# Patient Record
Sex: Male | Born: 1965 | Race: White | Hispanic: No | Smoking: Current some day smoker
Health system: Southern US, Community
[De-identification: ages and names within clinical notes are randomized; demographics above are authoritative.]

## PROBLEM LIST (undated history)

## (undated) DIAGNOSIS — I10 Essential (primary) hypertension: Secondary | ICD-10-CM

## (undated) DIAGNOSIS — T7840XA Allergy, unspecified, initial encounter: Secondary | ICD-10-CM

## (undated) DIAGNOSIS — M1990 Unspecified osteoarthritis, unspecified site: Secondary | ICD-10-CM

## (undated) DIAGNOSIS — E785 Hyperlipidemia, unspecified: Secondary | ICD-10-CM

## (undated) HISTORY — DX: Allergy, unspecified, initial encounter: T78.40XA

## (undated) HISTORY — DX: Hyperlipidemia, unspecified: E78.5

## (undated) HISTORY — DX: Essential (primary) hypertension: I10

## (undated) HISTORY — DX: Unspecified osteoarthritis, unspecified site: M19.90

---

## 1997-09-08 ENCOUNTER — Emergency Department (HOSPITAL_COMMUNITY): Admission: EM | Admit: 1997-09-08 | Discharge: 1997-09-08 | Payer: Self-pay | Admitting: Emergency Medicine

## 1998-06-27 ENCOUNTER — Emergency Department (HOSPITAL_COMMUNITY): Admission: EM | Admit: 1998-06-27 | Discharge: 1998-06-27 | Payer: Self-pay | Admitting: Emergency Medicine

## 1998-06-27 ENCOUNTER — Encounter: Payer: Self-pay | Admitting: Emergency Medicine

## 2016-08-30 ENCOUNTER — Emergency Department (HOSPITAL_COMMUNITY)
Admission: EM | Admit: 2016-08-30 | Discharge: 2016-08-30 | Disposition: A | Discharge status: Home / Self Care | Payer: BLUE CROSS/BLUE SHIELD | Attending: Emergency Medicine | Admitting: Emergency Medicine

## 2016-08-30 ENCOUNTER — Encounter (HOSPITAL_COMMUNITY): Payer: Self-pay | Admitting: Emergency Medicine

## 2016-08-30 DIAGNOSIS — Y939 Activity, unspecified: Secondary | ICD-10-CM | POA: Insufficient documentation

## 2016-08-30 DIAGNOSIS — Y929 Unspecified place or not applicable: Secondary | ICD-10-CM | POA: Insufficient documentation

## 2016-08-30 DIAGNOSIS — W290XXA Contact with powered kitchen appliance, initial encounter: Secondary | ICD-10-CM | POA: Insufficient documentation

## 2016-08-30 DIAGNOSIS — Z23 Encounter for immunization: Secondary | ICD-10-CM | POA: Insufficient documentation

## 2016-08-30 DIAGNOSIS — F17200 Nicotine dependence, unspecified, uncomplicated: Secondary | ICD-10-CM | POA: Insufficient documentation

## 2016-08-30 DIAGNOSIS — Y999 Unspecified external cause status: Secondary | ICD-10-CM | POA: Diagnosis not present

## 2016-08-30 DIAGNOSIS — S61012A Laceration without foreign body of left thumb without damage to nail, initial encounter: Secondary | ICD-10-CM | POA: Diagnosis not present

## 2016-08-30 MED ORDER — TETANUS-DIPHTH-ACELL PERTUSSIS 5-2.5-18.5 LF-MCG/0.5 IM SUSP
0.5000 mL | Freq: Once | INTRAMUSCULAR | Status: AC
  Administered 2016-08-30: 0.5 mL via INTRAMUSCULAR
  Filled 2016-08-30: qty 0.5

## 2016-08-30 MED ORDER — BACITRACIN ZINC 500 UNIT/GM EX OINT
TOPICAL_OINTMENT | Freq: Two times a day (BID) | CUTANEOUS | Status: DC
  Administered 2016-08-30: 1 via TOPICAL
  Filled 2016-08-30: qty 0.9

## 2016-08-30 NOTE — ED Provider Notes (Signed)
[facility] DEPT Provider Note   CSN: [id number] Arrival date & time: 08/30/16  1645     History   Chief Complaint Chief Complaint  Patient presents with  . Thumb Injury    HPI Erik Lin is a 51 y.o. male.  The history is provided by the patient.  51 year old male Who presents with left hand injury at 11:30AM. He is right-hand dominant and has no significant past medical history. Does not take blood thinners. Was wearing protective padding gloves and cutting metal with a grinder. He states that the wheel of the grinder accidentally touched the thumb of his left hand. It created a superficial laceration for which she was seen at urgent care. Urgent care was concerned about nail needed to be removed and he was sent to the ED for evaluation. Denies any other injuries. Unknown last tetanus shot. An x-ray was performed and he was told he did not have fracture or foreign bodies. He denies any skin discoloration, numbness or weakness.  History reviewed. No pertinent past medical history.  There are no active problems to display for this patient.   History reviewed. No pertinent surgical history.     Home Medications    Prior to Admission medications   Not on File    Family History No family history on file.  Social History Social History  Substance Use Topics  . Smoking status: Current Some Day Smoker  . Smokeless tobacco: Never Used  . Alcohol use Yes     Allergies   Patient has no known allergies.   Review of Systems Review of Systems  Constitutional: Negative for fever.  Respiratory: Negative for shortness of breath.   Cardiovascular: Negative for chest pain.  Skin: Positive for wound.  Allergic/Immunologic: Negative for immunocompromised state.  Neurological: Negative for weakness and numbness.  Hematological: Does not bruise/bleed easily.  Psychiatric/Behavioral: Negative for confusion.  All other systems reviewed and are negative.    Physical  Exam Updated Vital Signs BP (!) 158/97 (BP Location: Left Arm)   Pulse 78   Temp 97.7 F (36.5 C) (Oral)   Resp 18   Ht 5\' 6"  (1.676 m)   Wt 166 lb (75.3 kg)   SpO2 100%   BMI 26.79 kg/m   Physical Exam Physical Exam  Nursing note and vitals reviewed. Constitutional: Well developed, well nourished, non-toxic, and in no acute distress Head: Normocephalic and atraumatic.  Mouth/Throat: Oropharynx is clear and moist.  Neck: Normal range of motion. Neck supple.  Cardiovascular: Normal rate and regular rhythm.  +2 radial pulse bilaterally Pulmonary/Chest: Effort normal and breath sounds normal.  Abdominal: Soft. There is no tenderness. There is no rebound and no guarding.  Musculoskeletal: Normal range of motion of all digits of the left hand.  Neurological: Alert, no facial droop, fluent speech, moves all extremities symmetrically, sensation to light touch in tact over left hand Skin: Skin is warm and dry. there is laceration involving the lateral edge of the thumb nail without bleeding or foreign body. No subungal hematoma. Intact nailbed.  Psychiatric: Cooperative   ED Treatments / Results  Labs (all labs ordered are listed, but only abnormal results are displayed) Labs Reviewed - No data to display  EKG  EKG Interpretation None       Radiology No results found.  Procedures Procedures (including critical care time)  Medications Ordered in ED Medications  Tdap (BOOSTRIX) injection 0.5 mL (not administered)  bacitracin ointment (not administered)     Initial  Impression / Assessment and Plan / ED Course  I have reviewed the triage vital signs and the nursing notes.  Pertinent labs & imaging results that were available during my care of the patient were reviewed by me and considered in my medical decision making (see chart for details).     Tetanus updated. Hand neurovascularly in tact. No obvious nailbed injury and intact nailbed without subungual hematoma. Not  felt to require removal of the nail. X-ray was obtained at [facility] and he was told he did not have fracture or foreign body. Discussed supportive care/wound care instructions. Strict return and follow-up instructions reviewed. He expressed understanding of all discharge instructions and felt comfortable with the plan of care.   Final Clinical Impressions(s) / ED Diagnoses   Final diagnoses:  Thumb laceration, left, initial encounter    New Prescriptions New Prescriptions   No medications on file     Forde Dandy, MD 08/30/16 1734

## 2016-08-30 NOTE — ED Notes (Signed)
Security has pt's knife

## 2016-08-30 NOTE — ED Triage Notes (Signed)
Pt here from [facility] requesting left thumb nail to be removed; injury result cut off wheel on grinder.

## 2016-08-30 NOTE — Discharge Instructions (Signed)
Keep injury covered. You can continue to wash it with soapy water, but keep dry otherwise and covered especially while at work. You can use bacitracin over wound two times daily.  Return without fail for worsening symptoms, including fever, pus drainage, increased redness/swelling or pain, or any other symptoms concerning to you.

## 2019-10-22 ENCOUNTER — Ambulatory Visit: Payer: Self-pay | Attending: Internal Medicine

## 2019-10-22 DIAGNOSIS — Z23 Encounter for immunization: Secondary | ICD-10-CM

## 2019-10-22 NOTE — Progress Notes (Signed)
   Covid-19 Vaccination Clinic  Name:  MCARTHUR BERNASCONI    MRN: [id number] DOB: 01-18-1966  10/22/2019  Mr. Herdman was observed post Covid-19 immunization for 15 minutes without incident. He was provided with Vaccine Information Sheet and instruction to access the V-Safe system.   Mr. Buchanan was instructed to call 911 with any severe reactions post vaccine: Marland Kitchen Difficulty breathing  . Swelling of face and throat  . A fast heartbeat  . A bad rash all over body  . Dizziness and weakness   Immunizations Administered    Name Date Dose VIS Date Route   Pfizer COVID-19 Vaccine 10/22/2019 11:19 AM 0.3 mL 08/03/2018 Intramuscular   Manufacturer: [employer]   Lot: [id number]   Spray: [id number]

## 2019-11-14 ENCOUNTER — Ambulatory Visit: Payer: Self-pay | Attending: Internal Medicine

## 2019-11-14 DIAGNOSIS — Z23 Encounter for immunization: Secondary | ICD-10-CM

## 2019-11-14 NOTE — Progress Notes (Signed)
   Covid-19 Vaccination Clinic  Name:  Erik Lin    MRN: [id number] DOB: [birth year]  11/14/2019  Mr. Erik Lin was observed post Covid-19 immunization for 30 minutes based on pre-vaccination screening without incident. He was provided with Vaccine Information Sheet and instruction to access the V-Safe system.   Mr. Erik Lin was instructed to call 911 with any severe reactions post vaccine: Marland Kitchen Difficulty breathing  . Swelling of face and throat  . A fast heartbeat  . A bad rash all over body  . Dizziness and weakness   Immunizations Administered    Name Date Dose VIS Date Route   Pfizer COVID-19 Vaccine 11/14/2019  8:45 AM 0.3 mL 08/03/2018 Intramuscular   Manufacturer: [employer]   Lot: [id number]   [place]: [address]-6

## 2020-05-25 ENCOUNTER — Other Ambulatory Visit: Payer: Self-pay

## 2020-05-25 ENCOUNTER — Encounter: Payer: Self-pay | Admitting: Internal Medicine

## 2020-05-25 ENCOUNTER — Ambulatory Visit: Payer: PRIVATE HEALTH INSURANCE | Admitting: Internal Medicine

## 2020-05-25 VITALS — BP 131/90 | HR 64 | Ht 66.0 in | Wt 174.7 lb

## 2020-05-25 DIAGNOSIS — M1832 Unilateral post-traumatic osteoarthritis of first carpometacarpal joint, left hand: Secondary | ICD-10-CM

## 2020-05-25 DIAGNOSIS — Z1159 Encounter for screening for other viral diseases: Secondary | ICD-10-CM | POA: Diagnosis not present

## 2020-05-25 DIAGNOSIS — Z5320 Procedure and treatment not carried out because of patient's decision for unspecified reasons: Secondary | ICD-10-CM | POA: Diagnosis not present

## 2020-05-25 DIAGNOSIS — L80 Vitiligo: Secondary | ICD-10-CM | POA: Insufficient documentation

## 2020-05-25 DIAGNOSIS — I10 Essential (primary) hypertension: Secondary | ICD-10-CM

## 2020-05-25 DIAGNOSIS — Z114 Encounter for screening for human immunodeficiency virus [HIV]: Secondary | ICD-10-CM | POA: Diagnosis not present

## 2020-05-25 DIAGNOSIS — M189 Osteoarthritis of first carpometacarpal joint, unspecified: Secondary | ICD-10-CM | POA: Insufficient documentation

## 2020-05-25 DIAGNOSIS — Z789 Other specified health status: Secondary | ICD-10-CM | POA: Insufficient documentation

## 2020-05-25 DIAGNOSIS — F1090 Alcohol use, unspecified, uncomplicated: Secondary | ICD-10-CM | POA: Insufficient documentation

## 2020-05-25 DIAGNOSIS — Z0000 Encounter for general adult medical examination without abnormal findings: Secondary | ICD-10-CM

## 2020-05-25 DIAGNOSIS — Z7289 Other problems related to lifestyle: Secondary | ICD-10-CM

## 2020-05-25 DIAGNOSIS — Z1211 Encounter for screening for malignant neoplasm of colon: Secondary | ICD-10-CM

## 2020-05-25 NOTE — Progress Notes (Signed)
   CC: high blood pressure   HPI:  Mr.Erik Lin is a 54 y.o. with PMH as below.   Please see A&P for assessment of the patient's acute and chronic medical conditions.   Allergies and recurrent sinusitis. Fungal ear infections   Dad - prostate cancer  Mom - RA, vitiligo   Lives in [place].  [employer] superintendent  Smoke 4-5 cigarettes 5 nights per week  Drinks four beers per night and more on weekend   No past medical history on file.   Review of Systems:   Review of Systems  Constitutional: Negative for chills, diaphoresis, fever, malaise/fatigue and weight loss.  HENT: Negative for congestion, ear discharge, ear pain, sinus pain, sore throat and tinnitus.   Respiratory: Negative for cough, shortness of breath and wheezing.   Cardiovascular: Negative for chest pain, palpitations, orthopnea, claudication and leg swelling.  Gastrointestinal: Negative for abdominal pain, constipation, diarrhea, heartburn, melena, nausea and vomiting.  Genitourinary: Negative for dysuria, frequency and urgency.  Musculoskeletal: Positive for joint pain. Negative for falls and myalgias.  Neurological: Negative for dizziness, weakness and headaches.   Physical Exam: Constitution: NAD, appears stated age, well nourished appearing HENT: [place]/AT, moist mucous membranes Eyes: no icterus or injection  Cardio: RRR, no m/r/g, no LE edema  Respiratory: CTA, no w/r/r Abdominal: NTTP, soft, non-distended MSK:  LUE: squared base of 1st metacarpal with tenderness at base, pain with pinching 1st and 5th phalangeal, no swelling, erythema, warmth or limited ROM Neuro: normal affect, a&ox3 Skin: c/d/i    Vitals:   05/25/20 1030  BP: 131/90  Pulse: 64  SpO2: 98%    Assessment & Plan:   See Encounters Tab for problem based charting.  Patient discussed with Dr. Rebeca Alert

## 2020-05-25 NOTE — Assessment & Plan Note (Signed)
He has occasional pain in the base of his thumb, none currently. He injured it in 2019 and has had intermittent pain since then. He occasionally takes Iburprofen and uses aspercreme, both of which helps.  PE consistent with osteoarthritis. He does have vitiligo and family history of RA but no consistent symptoms of RA at this time.   - continue ibuprofen prn, discussed limiting use with alcohol use and risks and benefits  - cont. aspercreme  - f/u if symptoms worsen

## 2020-05-25 NOTE — Assessment & Plan Note (Signed)
Drinks 4 12oz bud light limes per night.  - discussed avoiding alcohol and NSAIDs together  - address decreasing alcohol consumption at follow-up

## 2020-05-25 NOTE — Progress Notes (Signed)
Internal Medicine Clinic Attending  Case discussed with Dr. Seawell at the time of the visit.  We reviewed the resident's history and exam and pertinent patient test results.  I agree with the assessment, diagnosis, and plan of care documented in the resident's note.  Marylynn Rigdon, M.D., Ph.D.  

## 2020-05-25 NOTE — Patient Instructions (Signed)
Thank you for allowing [place] to provide your care today. Today we discussed your osteoarthritis  I have ordered the following labs for you:  Basic metabolic panel, diabetes test, HCV and HIV screening    I will call if any are abnormal.    Today we made the following changes to your medications:   Please follow-up in one year.    Please call the internal medicine center clinic if you have any questions or concerns, we may be able to help and keep you from a long and expensive emergency room wait. Our clinic and after hours phone number is [phone], the best time to call is Monday through Friday 9 am to 4 pm but there is always someone available 24/7 if you have an emergency. If you need medication refills please notify your pharmacy one week in advance and they will send [place] a request.

## 2020-05-25 NOTE — Assessment & Plan Note (Signed)
BP Readings from Last 3 Encounters:  05/25/20 131/90  08/30/16 (!) 158/97   Blood presure initially 158/97. Repeat 131/90.   - check bmp, a1c, lipid panel

## 2020-05-25 NOTE — Assessment & Plan Note (Signed)
Vitiligo for the past 10-15 years that affects his wrists and feet. His mother also has vitiligo. No systemic symptoms. The discoloration does not bother him.   - TSH today

## 2020-05-25 NOTE — Assessment & Plan Note (Signed)
-   discussed obtaining covid booster. He has plans to do this but just has not had time. Encouraged him to obtain this  - declined flu vaccine - HCV/HIV screening today  - referral for screening colonoscopy

## 2020-05-26 LAB — BMP8+ANION GAP
Anion Gap: 15 mmol/L (ref 10.0–18.0)
BUN/Creatinine Ratio: 12 (ref 9–20)
BUN: 11 mg/dL (ref 6–24)
CO2: 24 mmol/L (ref 20–29)
Calcium: 9.7 mg/dL (ref 8.7–10.2)
Chloride: 100 mmol/L (ref 96–106)
Creatinine, Ser: 0.92 mg/dL (ref 0.76–1.27)
GFR calc Af Amer: 110 mL/min/{1.73_m2} (ref >59)
GFR calc non Af Amer: 95 mL/min/{1.73_m2} (ref >59)
Glucose: 79 mg/dL (ref 65–99)
Potassium: 4.9 mmol/L (ref 3.5–5.2)
Sodium: 139 mmol/L (ref 134–144)

## 2020-05-26 LAB — HIV ANTIBODY (ROUTINE TESTING W REFLEX): HIV Screen 4th Generation wRfx: NONREACTIVE

## 2020-05-26 LAB — LIPID PANEL
Chol/HDL Ratio: 3.4 ratio (ref 0.0–5.0)
Cholesterol, Total: 174 mg/dL (ref 100–199)
HDL: 51 mg/dL (ref >39)
LDL Chol Calc (NIH): 105 mg/dL — ABNORMAL HIGH (ref 0–99)
Triglycerides: 97 mg/dL (ref 0–149)
VLDL Cholesterol Cal: 18 mg/dL (ref 5–40)

## 2020-05-26 LAB — HEPATITIS C ANTIBODY: Hep C Virus Ab: 0.6 s/co ratio (ref 0.0–0.9)

## 2020-05-26 LAB — HEMOGLOBIN A1C
Est. average glucose Bld gHb Est-mCnc: 108 mg/dL
Hgb A1c MFr Bld: 5.4 % (ref 4.8–5.6)

## 2020-05-26 LAB — TSH: TSH: 0.954 u[IU]/mL (ref 0.450–4.500)

## 2020-05-28 ENCOUNTER — Encounter: Payer: Self-pay | Admitting: Internal Medicine

## 2020-06-27 ENCOUNTER — Encounter: Payer: Self-pay | Admitting: *Deleted

## 2020-08-15 ENCOUNTER — Ambulatory Visit (AMBULATORY_SURGERY_CENTER): Payer: PRIVATE HEALTH INSURANCE

## 2020-08-15 ENCOUNTER — Encounter: Payer: Self-pay | Admitting: Gastroenterology

## 2020-08-15 ENCOUNTER — Other Ambulatory Visit: Payer: Self-pay

## 2020-08-15 VITALS — Ht 66.0 in | Wt 171.0 lb

## 2020-08-15 DIAGNOSIS — Z1211 Encounter for screening for malignant neoplasm of colon: Secondary | ICD-10-CM

## 2020-08-15 MED ORDER — PLENVU 140 G PO SOLR: 1.0000 | ORAL | 0 refills | Status: DC

## 2020-08-15 NOTE — Progress Notes (Signed)
No allergies to soy or egg Pt is not on blood thinners or diet pills Denies  Sedation/intubation d/t to no surgeries or procedures. Denies atrial flutter/fib Denies constipation   Emmi instructions given to pt  Pt is aware of Covid safety and care partner requirements.

## 2020-08-23 ENCOUNTER — Telehealth: Payer: Self-pay | Admitting: Gastroenterology

## 2020-08-23 NOTE — Telephone Encounter (Signed)
Patient in a meeting. Patient notified that new prep instructions went to his mychart. Pt will call us back with questions.

## 2020-08-27 ENCOUNTER — Encounter: Payer: PRIVATE HEALTH INSURANCE | Admitting: Gastroenterology

## 2020-09-24 ENCOUNTER — Ambulatory Visit (AMBULATORY_SURGERY_CENTER): Payer: PRIVATE HEALTH INSURANCE | Admitting: Gastroenterology

## 2020-09-24 ENCOUNTER — Encounter: Payer: Self-pay | Admitting: Gastroenterology

## 2020-09-24 ENCOUNTER — Other Ambulatory Visit: Payer: Self-pay

## 2020-09-24 VITALS — BP 123/83 | HR 63 | Temp 98.4°F | Resp 14 | Ht 66.0 in | Wt 171.0 lb

## 2020-09-24 DIAGNOSIS — D125 Benign neoplasm of sigmoid colon: Secondary | ICD-10-CM

## 2020-09-24 DIAGNOSIS — Z1211 Encounter for screening for malignant neoplasm of colon: Secondary | ICD-10-CM

## 2020-09-24 MED ORDER — SODIUM CHLORIDE 0.9 % IV SOLN
500.0000 mL | Freq: Once | INTRAVENOUS | Status: DC
Start: 2020-09-24 — End: 2020-09-24

## 2020-09-24 NOTE — Progress Notes (Signed)
Report given to PACU, vss 

## 2020-09-24 NOTE — Progress Notes (Signed)
Medical history reviewed with no changes noted since PV. VS assessed by C.W 

## 2020-09-24 NOTE — Op Note (Signed)
[facility] Patient Name: [facility] Bachar Procedure Date: 09/24/2020 9:12 AM MRN: [id number] Endoscopist: Jackquline Denmark , MD Age: 55 Referring MD:  Date of Birth: [birth year] Gender: Male Account #: [id number] Procedure:                Colonoscopy Indications:              Screening for colorectal malignant neoplasm Medicines:                Monitored Anesthesia Care Procedure:                Pre-Anesthesia Assessment:                           - Prior to the procedure, a History and Physical                            was performed, and patient medications and                            allergies were reviewed. The patient's tolerance of                            previous anesthesia was also reviewed. The risks                            and benefits of the procedure and the sedation                            options and risks were discussed with the patient.                            All questions were answered, and informed consent                            was obtained. Prior Anticoagulants: The patient has                            taken no previous anticoagulant or antiplatelet                            agents. ASA Grade Assessment: II - A patient with                            mild systemic disease. After reviewing the risks                            and benefits, the patient was deemed in                            satisfactory condition to undergo the procedure.                           After obtaining informed consent, the colonoscope  was passed under direct vision. Throughout the                            procedure, the patient's blood pressure, pulse, and                            oxygen saturations were monitored continuously. The                            Colonoscope was introduced through the anus and                            advanced to the the cecum, identified by                            appendiceal orifice and ileocecal  valve. The                            colonoscopy was performed without difficulty. The                            patient tolerated the procedure well. The quality                            of the bowel preparation was adequate to identify                            polyps. The ileocecal valve, appendiceal orifice,                            and rectum were photographed. Scope In: 9:26:27 AM Scope Out: 9:37:45 AM Scope Withdrawal Time: 0 hours 9 minutes 12 seconds  Total Procedure Duration: 0 hours 11 minutes 18 seconds  Findings:                 A 10 mm polyp was found in the proximal sigmoid                            colon, 30 cm from the anal verge. The polyp was                            semi-sessile. The polyp was removed with a hot                            snare. Resection and retrieval were complete.                           A 4 mm polyp was found in the mid sigmoid colon.                            The polyp was sessile. The polyp was removed with a  cold snare. Resection and retrieval were complete.                           Multiple small and large-mouthed diverticula were                            found in the sigmoid colon, few in descending colon                            and ascending colon. It would give sigmoid colon a                            "Swiss cheese appearance". There was some luminal                            narrowing consistent with muscular hypertrophy.                           Non-bleeding internal hemorrhoids were found during                            retroflexion. The hemorrhoids were small.                           The exam was otherwise without abnormality on                            direct and retroflexion views. Complications:            No immediate complications. Estimated Blood Loss:     Estimated blood loss: none. Impression:               - One 10 mm polyp in the proximal sigmoid colon,                             removed with a hot snare. Resected and retrieved.                           - One 4 mm polyp in the mid sigmoid colon, removed                            with a cold snare. Resected and retrieved.                           - Moderate to severe predominantly sigmoid                            diverticulosis.                           - Non-bleeding internal hemorrhoids.                           - The examination was otherwise normal on direct  and retroflexion views. Recommendation:           - Patient has a contact number available for                            emergencies. The signs and symptoms of potential                            delayed complications were discussed with the                            patient. Return to normal activities tomorrow.                            Written discharge instructions were provided to the                            patient.                           - High-fiber diet.                           - Continue present medications.                           - Await pathology results.                           - No aspirin, ibuprofen, naproxen, or other                            non-steroidal anti-inflammatory drugs for 5 days                            after polyp removal.                           - Use Benefiber one teaspoon PO daily.                           - Repeat colonoscopy for surveillance based on                            pathology results.                           - The findings and recommendations were discussed                            with the patient's family. Jackquline Denmark, MD 09/24/2020 9:43:00 AM This report has been signed electronically.

## 2020-09-24 NOTE — Progress Notes (Signed)
Called to room to assist during endoscopic procedure.  Patient ID and intended procedure confirmed with present staff. Received instructions for my participation in the procedure from the performing physician.  

## 2020-09-24 NOTE — Patient Instructions (Signed)
Handouts provided on polyps, diverticulosis, hemorrhoids and high-fiber diet.   Recommend a high-fiber diet (see handout). Use Benefiber one teaspoon by mouth daily.   No aspirin, ibuprofen, naproxen, or other non-steriodal anti-inflammatory drugs (NSAIDs) for 5 days after polyp removal.    OU HAD AN ENDOSCOPIC PROCEDURE TODAY AT [facility]:   Refer to the procedure report that was given to you for any specific questions about what was found during the examination.  If the procedure report does not answer your questions, please call your gastroenterologist to clarify.  If you requested that your care partner not be given the details of your procedure findings, then the procedure report has been included in a sealed envelope for you to review at your convenience later.  YOU SHOULD EXPECT: Some feelings of bloating in the abdomen. Passage of more gas than usual.  Walking can help get rid of the air that was put into your GI tract during the procedure and reduce the bloating. If you had a lower endoscopy (such as a colonoscopy or flexible sigmoidoscopy) you may notice spotting of blood in your stool or on the toilet paper. If you underwent a bowel prep for your procedure, you may not have a normal bowel movement for a few days.  Please Note:  You might notice some irritation and congestion in your nose or some drainage.  This is from the oxygen used during your procedure.  There is no need for concern and it should clear up in a day or so.  SYMPTOMS TO REPORT IMMEDIATELY:   Following lower endoscopy (colonoscopy or flexible sigmoidoscopy):  Excessive amounts of blood in the stool  Significant tenderness or worsening of abdominal pains  Swelling of the abdomen that is new, acute  Fever of 100F or higher  For urgent or emergent issues, a gastroenterologist can be reached at any hour by calling [phone]. Do not use MyChart messaging for urgent concerns.    DIET:  We do  recommend a small meal at first, but then you may proceed to your regular diet.  Drink plenty of fluids but you should avoid alcoholic beverages for 24 hours.  ACTIVITY:  You should plan to take it easy for the rest of today and you should NOT DRIVE or use heavy machinery until tomorrow (because of the sedation medicines used during the test).    FOLLOW UP: Our staff will call the number listed on your records 48-72 hours following your procedure to check on you and address any questions or concerns that you may have regarding the information given to you following your procedure. If we do not reach you, we will leave a message.  We will attempt to reach you two times.  During this call, we will ask if you have developed any symptoms of COVID 19. If you develop any symptoms (ie: fever, flu-like symptoms, shortness of breath, cough etc.) before then, please call [phone].  If you test positive for Covid 19 in the 2 weeks post procedure, please call and report this information to [place].    If any biopsies were taken you will be contacted by phone or by letter within the next 1-3 weeks.  Please call us at [phone] if you have not heard about the biopsies in 3 weeks.    SIGNATURES/CONFIDENTIALITY: You and/or your care partner have signed paperwork which will be entered into your electronic medical record.  These signatures attest to the fact that that the information above on  your After Visit Summary has been reviewed and is understood.  Full responsibility of the confidentiality of this discharge information lies with you and/or your care-partner.

## 2020-09-26 ENCOUNTER — Telehealth: Payer: Self-pay

## 2020-09-26 NOTE — Telephone Encounter (Signed)
  Follow up Call-  Call back number 09/24/2020  Post procedure Call Back phone  # [phone]  Permission to leave phone message Yes  Some recent data might be hidden     Patient questions:  Do you have a fever, pain , or abdominal swelling? No. Pain Score  0 *  Have you tolerated food without any problems? Yes.    Have you been able to return to your normal activities? Yes.    Do you have any questions about your discharge instructions: Diet   No. Medications  Yes.   pt asked if taking Benefiber Gummies would be ok since post care instructions included taking a tsp of Benefiber daily.  RN instructed patient this would be ok to just be sure to follow the dosage instruction provided by the maker. Follow up visit  No.  Do you have questions or concerns about your Care? No.  Actions: * If pain score is 4 or above: No action needed, pain <4.  1. Have you developed a fever since your procedure? no  2.   Have you had an respiratory symptoms (SOB or cough) since your procedure? no  3.   Have you tested positive for COVID 19 since your procedure no  4.   Have you had any family members/close contacts diagnosed with the COVID 19 since your procedure?  no   If yes to any of these questions please route to Joylene John, RN and Joella Prince, RN

## 2020-10-04 ENCOUNTER — Encounter: Payer: Self-pay | Admitting: Gastroenterology

## 2020-10-13 ENCOUNTER — Other Ambulatory Visit: Payer: Self-pay

## 2020-10-13 ENCOUNTER — Ambulatory Visit (HOSPITAL_COMMUNITY)
Admission: EM | Admit: 2020-10-13 | Discharge: 2020-10-13 | Disposition: A | Discharge status: Home / Self Care | Payer: PRIVATE HEALTH INSURANCE | Attending: Emergency Medicine | Admitting: Emergency Medicine

## 2020-10-13 ENCOUNTER — Encounter (HOSPITAL_COMMUNITY): Payer: Self-pay | Admitting: Emergency Medicine

## 2020-10-13 ENCOUNTER — Ambulatory Visit (INDEPENDENT_AMBULATORY_CARE_PROVIDER_SITE_OTHER): Payer: PRIVATE HEALTH INSURANCE

## 2020-10-13 DIAGNOSIS — I10 Essential (primary) hypertension: Secondary | ICD-10-CM

## 2020-10-13 DIAGNOSIS — M25562 Pain in left knee: Secondary | ICD-10-CM

## 2020-10-13 MED ORDER — IBUPROFEN 600 MG PO TABS: 600.0000 mg | ORAL_TABLET | Freq: Four times a day (QID) | ORAL | 0 refills | Status: AC | PRN

## 2020-10-13 NOTE — ED Triage Notes (Signed)
Pt is present today with left knee pain. Pt states that he noticed the pain Wednesday. Pt states that he step out of his truck and his knee turned side ways and felt a pain shoot through his knee. Pt states that he does have some swelling on his left knee expanding down to his foot.

## 2020-10-13 NOTE — ED Provider Notes (Signed)
[facility]    CSN: [id number] Arrival date & time: 10/13/20  1654      History   Chief Complaint Chief Complaint  Patient presents with  . Knee Pain    Left     HPI Erik Lin is a 55 y.o. male.   Patient presents with left knee pain and swelling x3 days.  The pain started when he twisted his knee while stepping out of his truck.  He states he has mild intermittent numbness in his left lower leg and foot.  He denies weakness, paresthesias, redness, bruising, wounds, rash, or other symptoms.  Treatment attempted at home with Aleve and Ace wrap.  His medical history includes hypertension, arthritis, current everyday smoker, alcohol use.  The history is provided by the patient and medical records.    Past Medical History:  Diagnosis Date  . Allergy    seasonal  . Arthritis   . Hyperlipidemia   . Hypertension     Patient Active Problem List   Diagnosis Date Noted  . 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 Alcohol use 05/25/2020  . Vitiligo 05/25/2020  . Healthcare maintenance 05/25/2020  . High blood pressure 05/25/2020    History reviewed. No pertinent surgical history.     Home Medications    Prior to Admission medications   Medication Sig Start Date End Date Taking? Authorizing Provider  ibuprofen (ADVIL) 600 MG tablet Take 1 tablet (600 mg total) by mouth every 6 (six) hours as needed. 10/13/20  Yes Sharion Balloon, NP  Chlorphen-Pseudoephed-APAP (TYLENOL ALLERGY SINUS PO) Take by mouth. Patient not taking: No sig reported    [provider]  dextromethorphan-guaiFENesin (MUCINEX DM) 30-600 MG 12hr tablet Take 1 tablet by mouth 2 (two) times daily.    [provider]  diphenhydrAMINE HCl (BENADRYL PO) Take by mouth. Patient not taking: Reported on 09/24/2020    [provider]  Pseudoeph-Doxylamine-DM-APAP (NYQUIL PO) Take by mouth. Patient not taking: No sig reported    [provider]   Pseudoephedrine-APAP-DM (DAYQUIL PO) Take by mouth. Patient not taking: No sig reported    [provider]    Family History Family History  Problem Relation Age of Onset  . Colon cancer Neg Hx   . Colon polyps Neg Hx   . Esophageal cancer Neg Hx   . Stomach cancer Neg Hx   . Rectal cancer Neg Hx     Social History Social History   Tobacco Use  . Smoking status: Current Some Day Smoker    Types: Cigarettes  . Smokeless tobacco: Never Used  . Tobacco comment: 4-5 cigs/day  Vaping Use  . Vaping Use: Never used  Substance Use Topics  . Alcohol use: Yes    Comment: 3-4 times/wk  . Drug use: Yes    Types: Marijuana    Comment: occasionally     Allergies   Patient has no known allergies.   Review of Systems Review of Systems  Constitutional: Negative for chills and fever.  Respiratory: Negative for cough and shortness of breath.   Cardiovascular: Negative for chest pain and palpitations.  Gastrointestinal: Negative for abdominal pain and vomiting.  Musculoskeletal: Positive for arthralgias, gait problem and joint swelling.  Skin: Negative for color change and rash.  Neurological: Positive for numbness. Negative for weakness.  All other systems reviewed and are negative.    Physical Exam Triage Vital Signs ED Triage Vitals  Enc Vitals Group  BP      Pulse      Resp      Temp      Temp src      SpO2      Weight      Height      Head Circumference      Peak Flow      Pain Score      Pain Loc      Pain Edu?      Excl. in [place]?    No data found.  Updated Vital Signs BP (!) 151/83 (BP Location: Right Arm)   Pulse 85   Temp 98.5 F (36.9 C) (Oral)   Resp 17   SpO2 95%   Visual Acuity Right Eye Distance:   Left Eye Distance:   Bilateral Distance:    Right Eye Near:   Left Eye Near:    Bilateral Near:     Physical Exam Vitals and nursing note reviewed.  Constitutional:      General: He is not in acute distress.    Appearance: He  is well-developed.  HENT:     Head: Normocephalic and atraumatic.     Mouth/Throat:     Mouth: Mucous membranes are moist.  Eyes:     Conjunctiva/sclera: Conjunctivae normal.  Cardiovascular:     Rate and Rhythm: Normal rate and regular rhythm.     Heart sounds: Normal heart sounds.  Pulmonary:     Effort: Pulmonary effort is normal. No respiratory distress.     Breath sounds: Normal breath sounds.  Abdominal:     Palpations: Abdomen is soft.     Tenderness: There is no abdominal tenderness.  Musculoskeletal:        General: Swelling and tenderness present. No deformity. Normal range of motion.     Cervical back: Neck supple.       Legs:  Skin:    General: Skin is warm and dry.     Capillary Refill: Capillary refill takes less than 2 seconds.     Findings: No bruising, erythema, lesion or rash.  Neurological:     General: No focal deficit present.     Mental Status: He is alert and oriented to person, place, and time.     Sensory: No sensory deficit.     Motor: No weakness.     Gait: Gait normal.  Psychiatric:        Mood and Affect: Mood normal.        Behavior: Behavior normal.      UC Treatments / Results  Labs (all labs ordered are listed, but only abnormal results are displayed) Labs Reviewed - No data to display  EKG   Radiology DG Knee Complete 4 Views Left  Result Date: 10/13/2020 CLINICAL DATA:  Left knee pain, no known injury, initial encounter EXAM: LEFT KNEE - COMPLETE 4+ VIEW COMPARISON:  None. FINDINGS: Small joint effusion is noted. No acute fracture or dislocation is noted. No other soft tissue abnormality is noted. IMPRESSION: Joint effusion without acute bony abnormality. Electronically Signed   By: Inez Catalina M.D.   On: 10/13/2020 18:20    Procedures Procedures (including critical care time)  Medications Ordered in UC Medications - No data to display  Initial Impression / Assessment and Plan / UC Course  I have reviewed the triage vital  signs and the nursing notes.  Pertinent labs & imaging results that were available during my care of the patient were reviewed by me and  considered in my medical decision making (see chart for details).   Acute left knee pain.  Elevated blood pressure reading with known hypertension.  X-ray shows no acute bony abnormality.  Treating with knee sleeve, crutches, rest, elevation, ice packs.  Instructed patient to follow-up with orthopedics as soon as possible.  Discussed local orthopedic urgent cares.  Also discussed that his blood pressure is elevated today and needs to be rechecked by his PCP in 2 to 4 weeks.  Education on managing hypertension provided.  He agrees to plan of care.   Final Clinical Impressions(s) / UC Diagnoses   Final diagnoses:  Acute pain of left knee  Elevated blood pressure reading in office with diagnosis of hypertension     Discharge Instructions     Take the ibuprofen as prescribed.  Rest and elevate your knee.  Apply ice packs 2-3 times a day for up to 20 minutes each.  Wear the knee sleeve and use the crutches.   Follow up with an orthopedist.  Your blood pressure is elevated today at 151/83.  Please have this rechecked by your primary care provider in 2-4 weeks.         ED Prescriptions    Medication Sig Dispense Auth. Provider   ibuprofen (ADVIL) 600 MG tablet Take 1 tablet (600 mg total) by mouth every 6 (six) hours as needed. 30 tablet Sharion Balloon, NP     I have reviewed the PDMP during this encounter.   Sharion Balloon, NP 10/13/20 [phone]

## 2020-10-13 NOTE — Discharge Instructions (Addendum)
Take the ibuprofen as prescribed.  Rest and elevate your knee.  Apply ice packs 2-3 times a day for up to 20 minutes each.  Wear the knee sleeve and use the crutches.   Follow up with an orthopedist.  Your blood pressure is elevated today at 151/83.  Please have this rechecked by your primary care provider in 2-4 weeks.

## 2020-12-11 ENCOUNTER — Encounter: Payer: Self-pay | Admitting: *Deleted

## 2021-12-16 ENCOUNTER — Encounter (HOSPITAL_COMMUNITY): Payer: Self-pay

## 2021-12-16 ENCOUNTER — Ambulatory Visit (HOSPITAL_COMMUNITY)
Admission: EM | Admit: 2021-12-16 | Discharge: 2021-12-16 | Disposition: A | Discharge status: Home / Self Care | Payer: PRIVATE HEALTH INSURANCE | Attending: Urgent Care | Admitting: Urgent Care

## 2021-12-16 DIAGNOSIS — H7292 Unspecified perforation of tympanic membrane, left ear: Secondary | ICD-10-CM | POA: Insufficient documentation

## 2021-12-16 DIAGNOSIS — H6692 Otitis media, unspecified, left ear: Secondary | ICD-10-CM | POA: Insufficient documentation

## 2021-12-16 DIAGNOSIS — H60392 Other infective otitis externa, left ear: Secondary | ICD-10-CM | POA: Insufficient documentation

## 2021-12-16 MED ORDER — CIPROFLOXACIN-DEXAMETHASONE 0.3-0.1 % OT SUSP: 4.0000 [drp] | Freq: Two times a day (BID) | OTIC | 0 refills | Status: AC

## 2021-12-16 MED ORDER — AMOXICILLIN-POT CLAVULANATE 875-125 MG PO TABS: 1.0000 | ORAL_TABLET | Freq: Two times a day (BID) | ORAL | 0 refills | Status: AC

## 2021-12-16 NOTE — Discharge Instructions (Addendum)
Your ear drum on the L appears perforated. You have an inner ear infection. You also have an outer ear infection. Please start taking the augmentin twice daily with food. Use four drops twice daily in L ear canal, leave head tilted to side for 10 min after administering to allow proper penetration in ear canal. We will notify you if the results of the culture require a change in your treatment.  Please follow up with your ENT. If any new symptoms - worsening ear pain, drainage, fever or headache, return for recheck

## 2021-12-16 NOTE — ED Provider Notes (Signed)
Erik Lin    CSN: [id number] Arrival date & time: 12/16/21  4818      History   Chief Complaint Chief Complaint  Patient presents with   Ear Pain    HPI Erik Lin is a 56 y.o. male.   Pleasant 56 year old male presents today with concern of left ear discharge for the past 3 weeks or so.  Reports a history of fungal ear infections on the left in the past, states it feels similar.  He has not been using any over-the-counter treatments, but states he will have water run into his ear while showering and has been getting "black stuff out of it".  He denies any pain, but states his ear feels very clogged.  Denies any additional new URI symptoms, admits to a longstanding history of chronic congestion from allergies. Has been in the pool and tubs recently.      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 No pertinent surgical history.     Home Medications    Prior to Admission medications   Medication Sig Start Date End Date Taking? Authorizing Provider  amoxicillin-clavulanate (AUGMENTIN) 875-125 MG tablet Take 1 tablet by mouth every 12 (twelve) hours for 10 days. 12/16/21 12/26/21 Yes Jara Feider L, PA  ciprofloxacin-dexamethasone (CIPRODEX) OTIC suspension Place 4 drops into the left ear 2 (two) times daily for 7 days. 12/16/21 12/23/21 Yes Delesa Kawa L, PA  dextromethorphan-guaiFENesin (MUCINEX DM) 30-600 MG 12hr tablet Take 1 tablet by mouth 2 (two) times daily.    [provider]  ibuprofen (ADVIL) 600 MG tablet Take 1 tablet (600 mg total) by mouth every 6 (six) hours as needed. 10/13/20   Sharion Balloon, NP    Family History Family  History  Problem Relation Age of Onset   Colon cancer Neg Hx    Colon polyps Neg Hx    Esophageal cancer Neg Hx    Stomach cancer Neg Hx    Rectal cancer Neg Hx     Social History Social History   Tobacco Use   Smoking status: Some Days    Types: Cigarettes   Smokeless tobacco: Never   Tobacco comments:    4-5 cigs/day  Vaping Use   Vaping Use: Never used  Substance Use Topics   Alcohol use: Yes    Comment: 3-4 times/wk   Drug use: Yes    Types: Marijuana    Comment: occasionally     Allergies   Patient has no known allergies.   Review of Systems Review of Systems  HENT:  Positive for congestion and ear discharge.   All other systems reviewed and are negative.    Physical Exam Triage Vital Signs ED Triage Vitals  Enc Vitals Group     BP 12/16/21 0954 (!) 146/90     Pulse Rate 12/16/21 0954 63     Resp 12/16/21 0954 18     Temp 12/16/21 0955 98.3 F (36.8 C)     Temp Source 12/16/21 0955 Oral     SpO2 12/16/21 0954 99 %     Weight --  Height --      Head Circumference --      Peak Flow --      Pain Score --      Pain Loc --      Pain Edu? --      Excl. in [place]? --    No data found.  Updated Vital Signs BP (!) 146/90 (BP Location: Left Arm)   Pulse 63   Temp 98.3 F (36.8 C) (Oral)   Resp 18   SpO2 99%   Visual Acuity Right Eye Distance:   Left Eye Distance:   Bilateral Distance:    Right Eye Near:   Left Eye Near:    Bilateral Near:     Physical Exam Vitals and nursing note reviewed.  Constitutional:      General: He is not in acute distress.    Appearance: Normal appearance. He is normal weight. He is not ill-appearing, toxic-appearing or diaphoretic.  HENT:     Head: Normocephalic and atraumatic. No right periorbital erythema or left periorbital erythema.     Jaw: There is normal jaw occlusion. No trismus, tenderness, swelling, pain on movement or malocclusion.     Salivary Glands: Right salivary gland is not diffusely enlarged  or tender. Left salivary gland is not diffusely enlarged.     Right Ear: Hearing, tympanic membrane, ear canal and external ear normal. No drainage, swelling or tenderness. No middle ear effusion. There is no impacted cerumen. No mastoid tenderness. Tympanic membrane is not erythematous or bulging.     Left Ear: Ear canal and external ear normal. Drainage and swelling present. No tenderness.  No middle ear effusion. There is no impacted cerumen. No mastoid tenderness. Tympanic membrane is scarred and perforated. Tympanic membrane is not erythematous or bulging. Tympanic membrane has normal mobility.     Ears:      Comments: Very opacified TM on L with perforation to 6 o;clock with serous drainage  Significant edema and discharge to L EAC only, R EAC normal    Nose: Nose normal. No nasal deformity, signs of injury, nasal tenderness or congestion.     Right Turbinates: Not enlarged or swollen.     Left Turbinates: Not enlarged or swollen.     Right Sinus: No maxillary sinus tenderness or frontal sinus tenderness.     Left Sinus: No maxillary sinus tenderness or frontal sinus tenderness.     Mouth/Throat:     Lips: Pink. No lesions.     Mouth: Mucous membranes are moist. No injury or oral lesions.     Tongue: No lesions.     Palate: No lesions.     Pharynx: Oropharynx is clear. Uvula midline. No pharyngeal swelling, oropharyngeal exudate or posterior oropharyngeal erythema.     Tonsils: No tonsillar exudate or tonsillar abscesses.  Eyes:     General: No scleral icterus.       Right eye: No discharge.        Left eye: No discharge.     Extraocular Movements: Extraocular movements intact.     Conjunctiva/sclera: Conjunctivae normal.     Pupils: Pupils are equal, round, and reactive to light.  Neurological:     Mental Status: He is alert.      UC Treatments / Results  Labs (all labs ordered are listed, but only abnormal results are displayed) Labs Reviewed  AEROBIC/ANAEROBIC CULTURE W  GRAM STAIN (SURGICAL/DEEP WOUND)    EKG   Radiology No results found.  Procedures Procedures (including critical  care time)  Medications Ordered in UC Medications - No data to display  Initial Impression / Assessment and Plan / UC Course  I have reviewed the triage vital signs and the nursing notes.  Pertinent labs & imaging results that were available during my care of the patient were reviewed by me and considered in my medical decision making (see chart for details).     Acute OM with perforation of TM - on L. Will start PO abx to cover for bacterial causes; Augmentin BID x 10 days. F/U with ENT L OE - ciprodex 4 drops BID x 7 days. Wound culture obtained to r/o fungal infection in which case pt will also need acetic acid drops and f/u with ENT. Pt denies hx of DM  Final Clinical Impressions(s) / UC Diagnoses   Final diagnoses:  Acute otitis media of left ear with perforated tympanic membrane  Other infective otitis externa, left ear     Discharge Instructions      Your ear drum on the L appears perforated. You have an inner ear infection. You also have an outer ear infection. Please start taking the augmentin twice daily with food. Use four drops twice daily in L ear canal, leave head tilted to side for 10 min after administering to allow proper penetration in ear canal. We will notify you if the results of the culture require a change in your treatment.  Please follow up with your ENT. If any new symptoms - worsening ear pain, drainage, fever or headache, return for recheck     ED Prescriptions     Medication Sig Dispense Auth. Provider   amoxicillin-clavulanate (AUGMENTIN) 875-125 MG tablet Take 1 tablet by mouth every 12 (twelve) hours for 10 days. 20 tablet Deneise Getty L, PA   ciprofloxacin-dexamethasone (CIPRODEX) OTIC suspension Place 4 drops into the left ear 2 (two) times daily for 7 days. 7.5 mL Delcia Spitzley L, PA      PDMP not reviewed this  encounter.   Chaney Malling, [place] 12/16/21 1050

## 2021-12-16 NOTE — ED Triage Notes (Signed)
Pt reports left ear pain x 3-4 days.

## 2021-12-22 LAB — AEROBIC/ANAEROBIC CULTURE W GRAM STAIN (SURGICAL/DEEP WOUND): Gram Stain: NONE SEEN

## 2022-01-14 IMAGING — DX DG KNEE COMPLETE 4+V*L*
4 series · 4 of 4 positions shown · non-contrast
Comparison: None.

CLINICAL DATA: Left knee pain, no known injury, initial encounter

EXAM:
LEFT KNEE - COMPLETE 4+ VIEW

[knee ap]
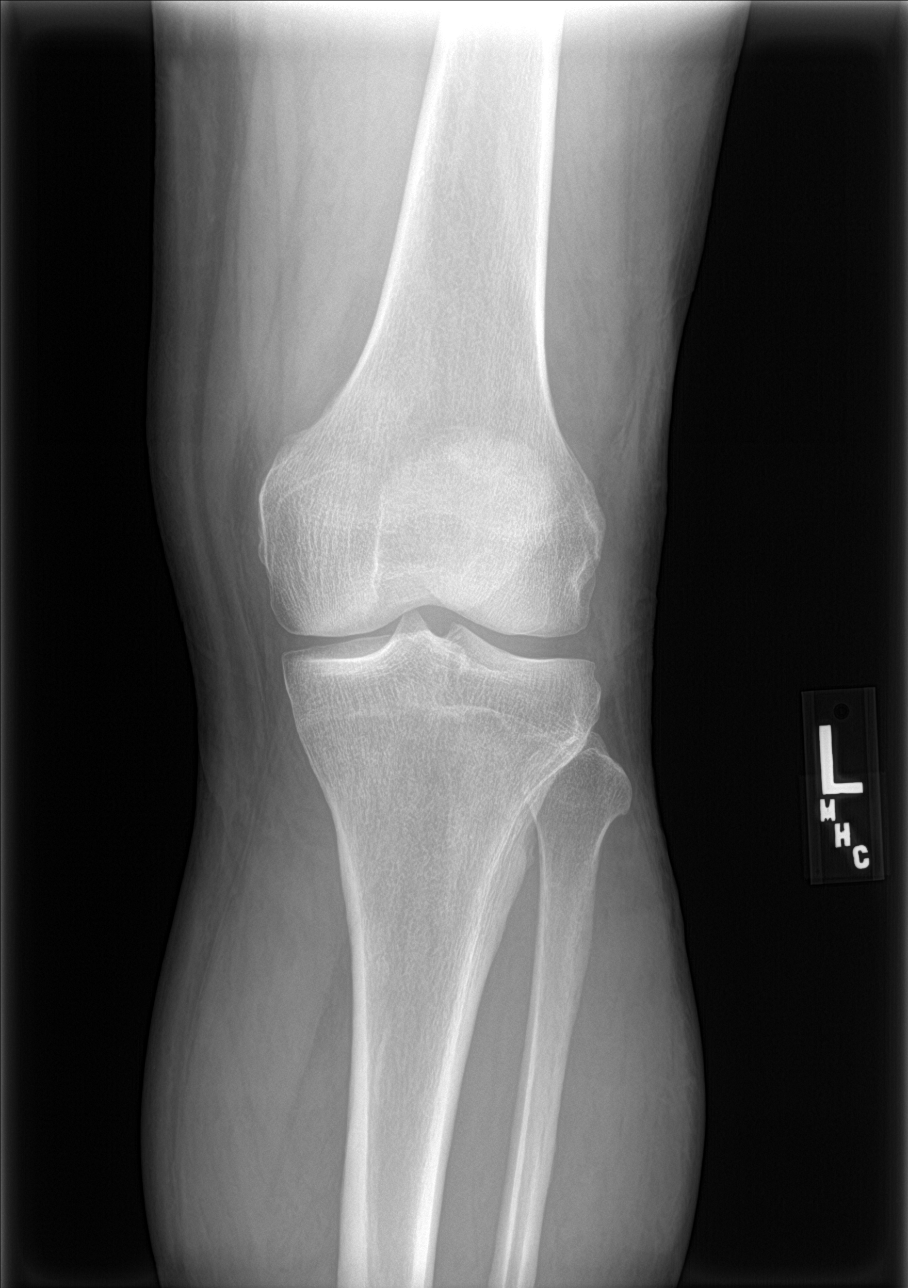

[knee obl (1 of 2)]
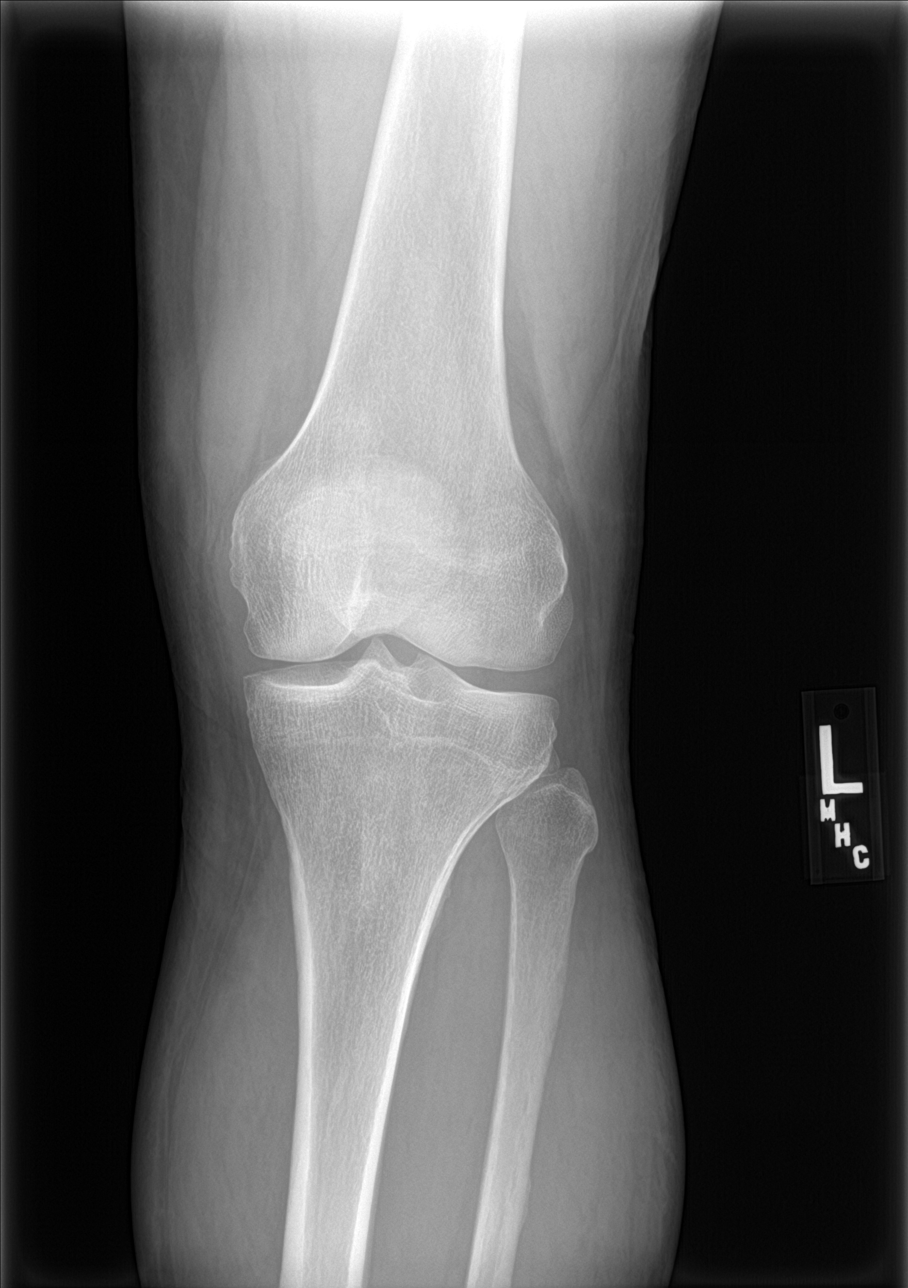

[knee obl (2 of 2)]
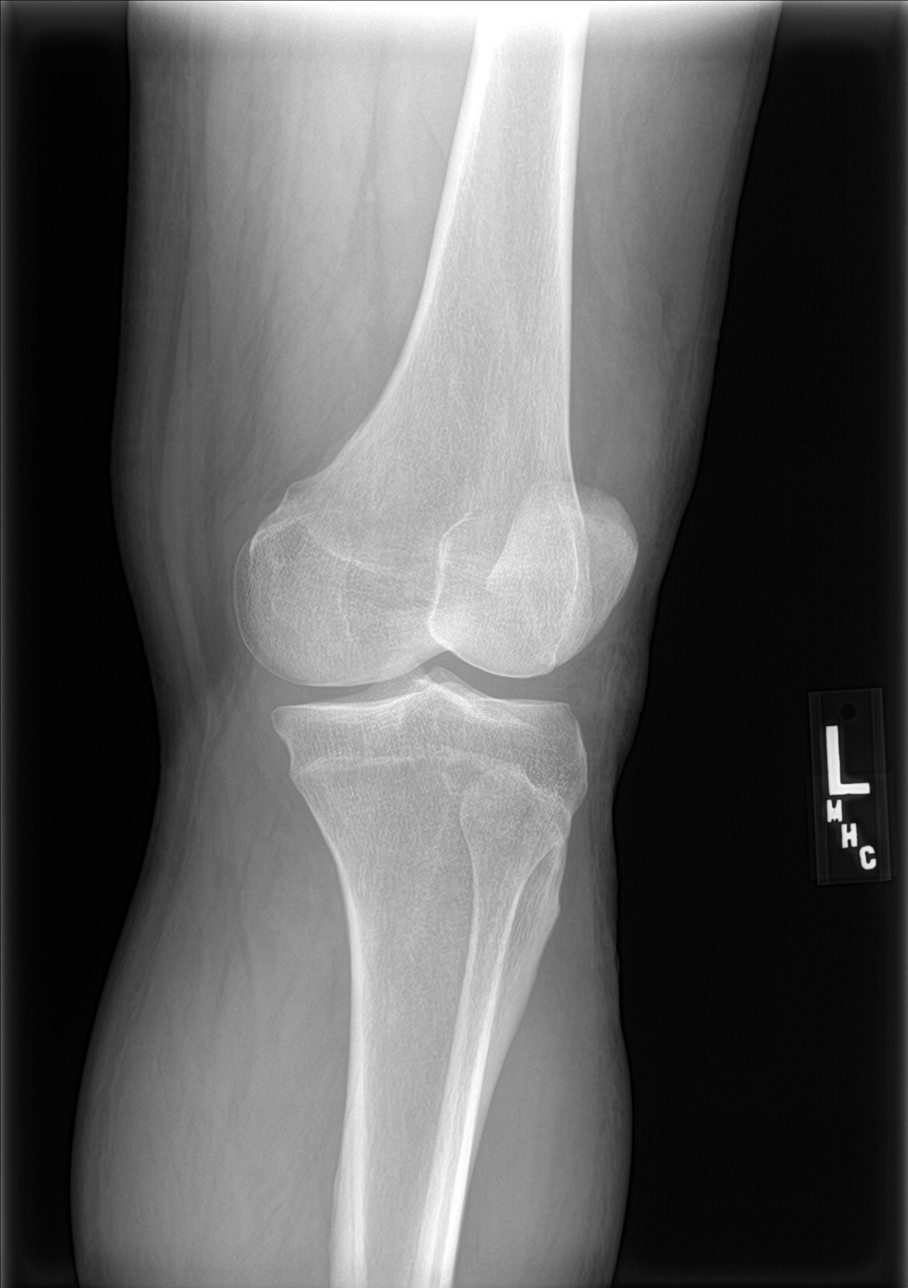

[knee lat]
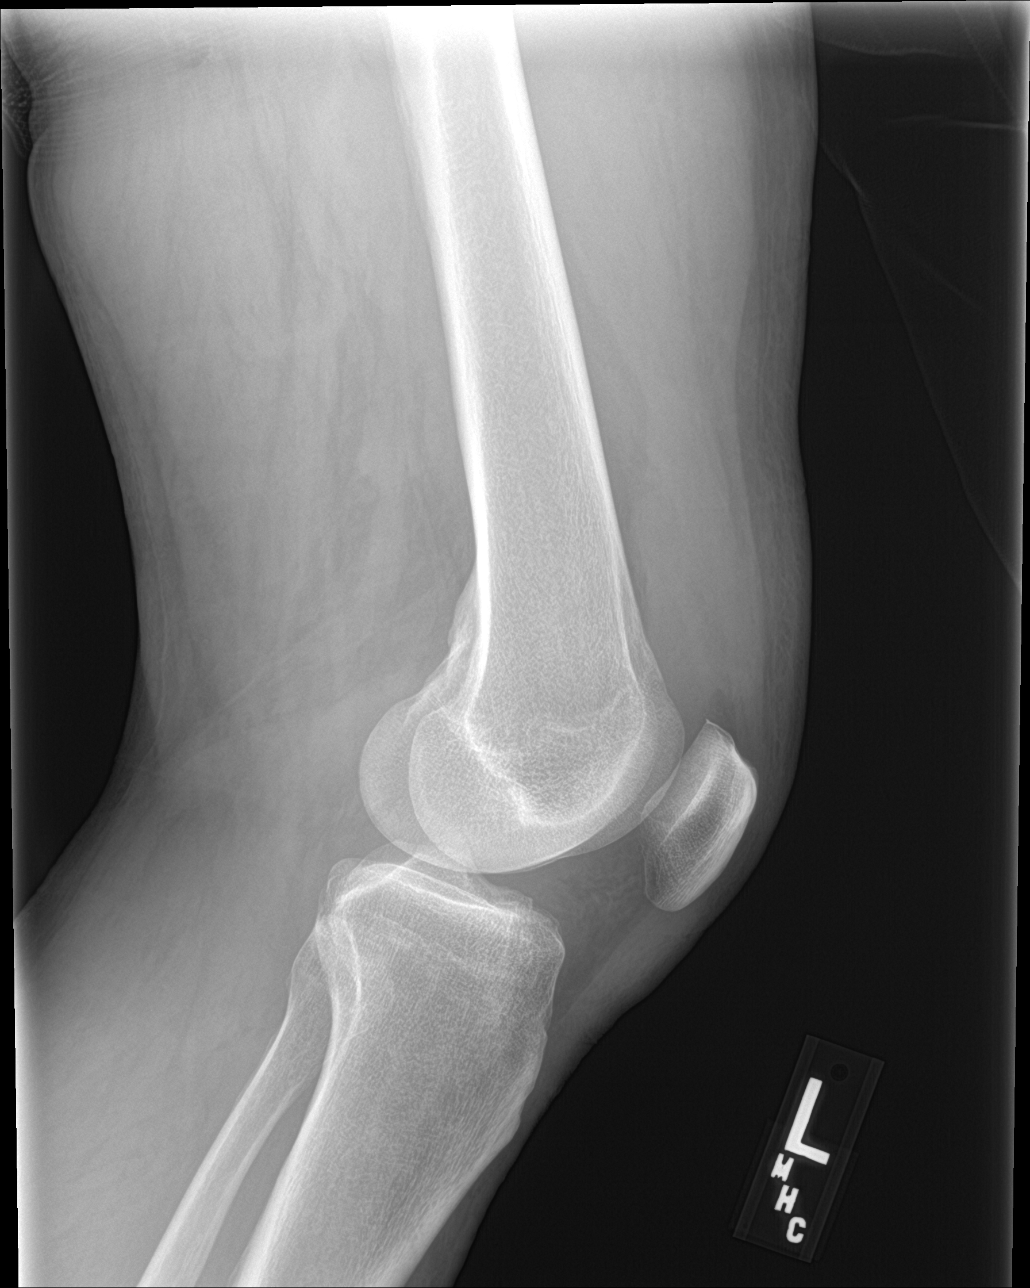

[4 of 4 positions shown; findings below may reference images not displayed]

FINDINGS: Small joint effusion is noted. No acute fracture or dislocation is
noted. No other soft tissue abnormality is noted.
IMPRESSION: Joint effusion without acute bony abnormality.
